# Patient Record
Sex: Female | Born: 1970 | Race: Black or African American | Hispanic: No | Marital: Single | Smoking: Never smoker
Health system: Southern US, Community
[De-identification: ages and names within clinical notes are randomized; demographics above are authoritative.]

## PROBLEM LIST (undated history)

## (undated) DIAGNOSIS — B009 Herpesviral infection, unspecified: Secondary | ICD-10-CM

## (undated) DIAGNOSIS — A749 Chlamydial infection, unspecified: Secondary | ICD-10-CM

## (undated) HISTORY — PX: WISDOM TOOTH EXTRACTION: SHX21

## (undated) HISTORY — PX: SALPINGECTOMY: SHX328

## (undated) HISTORY — DX: Herpesviral infection, unspecified: B00.9

## (undated) HISTORY — DX: Chlamydial infection, unspecified: A74.9

---

## 2002-06-19 ENCOUNTER — Other Ambulatory Visit: Admission: RE | Admit: 2002-06-19 | Discharge: 2002-06-19 | Payer: Self-pay | Admitting: Obstetrics & Gynecology

## 2004-07-27 ENCOUNTER — Other Ambulatory Visit: Admission: RE | Admit: 2004-07-27 | Discharge: 2004-07-27 | Payer: Self-pay | Admitting: Obstetrics and Gynecology

## 2007-07-20 ENCOUNTER — Inpatient Hospital Stay (HOSPITAL_COMMUNITY): Admission: AD | Admit: 2007-07-20 | Discharge: 2007-07-20 | Payer: Self-pay | Admitting: Obstetrics and Gynecology

## 2009-07-25 IMAGING — US US OB COMP LESS 14 WK
1 series · 14 of 28 positions shown · non-contrast
Comparison: none

DUPLICATE COPY for exam association in [facility] ? No change from original report.
CLINICAL DATA: 37-year-old female 7.5 weeks pregnant. Abdominal
pain and bleeding

OBSTETRIC <14 WK US AND TRANSVAGINAL OB US
TECHNIQUE: Both transabdominal and transvaginal ultrasound
examinations were performed for complete evaluation of the
gestation as well as the maternal uterus, adnexal regions, and
pelvic cul-de-sac.

[Series 1: us ob comp less 14 wks · 14 of 41 slices shown]
[im 2/41]
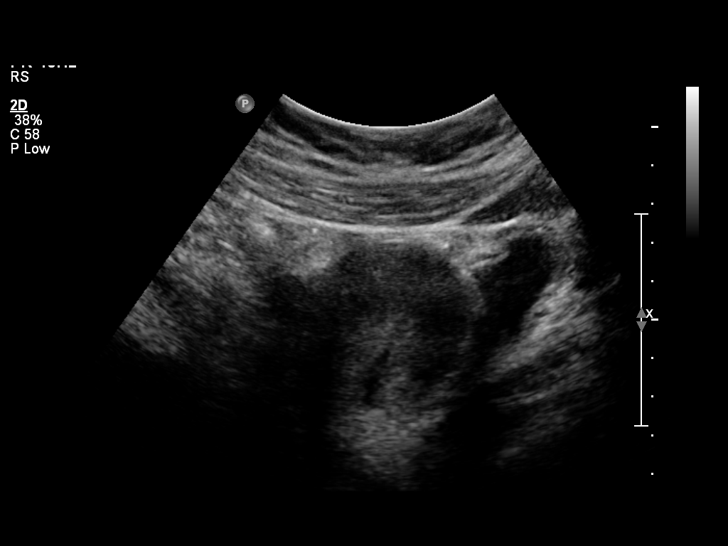
[im 5/41]
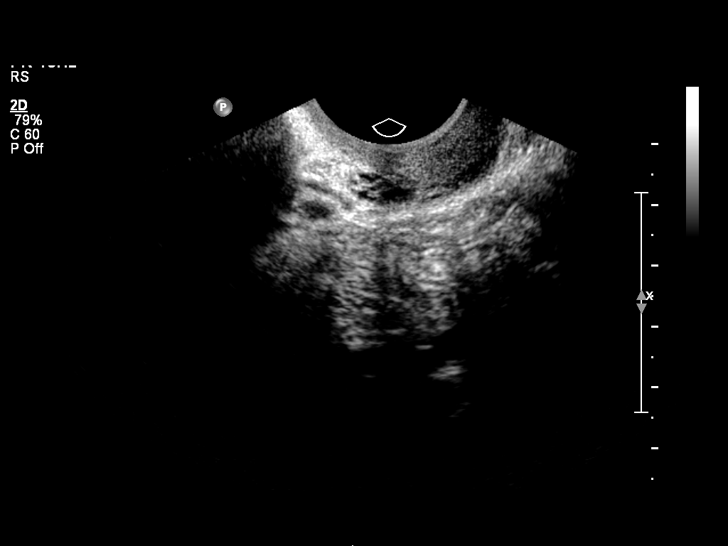
[im 8/41]
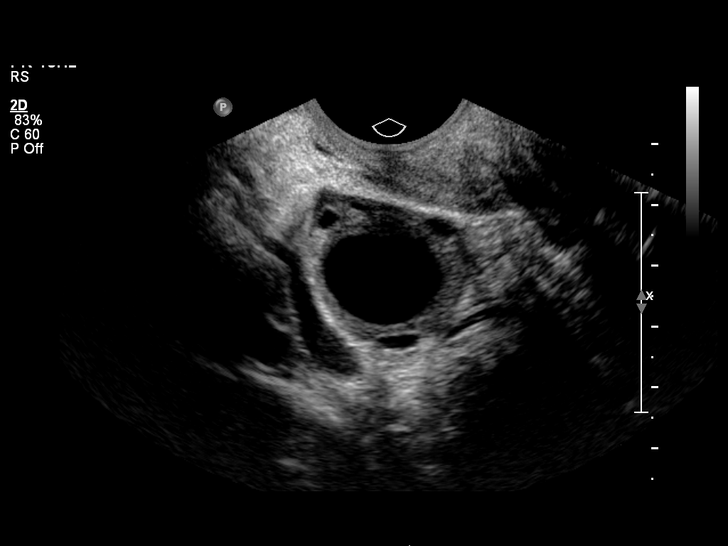
[im 11/41]
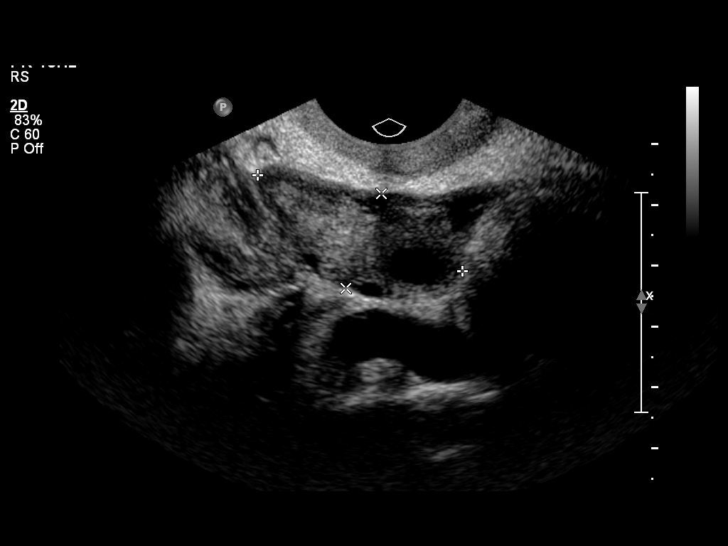
[im 14/41]
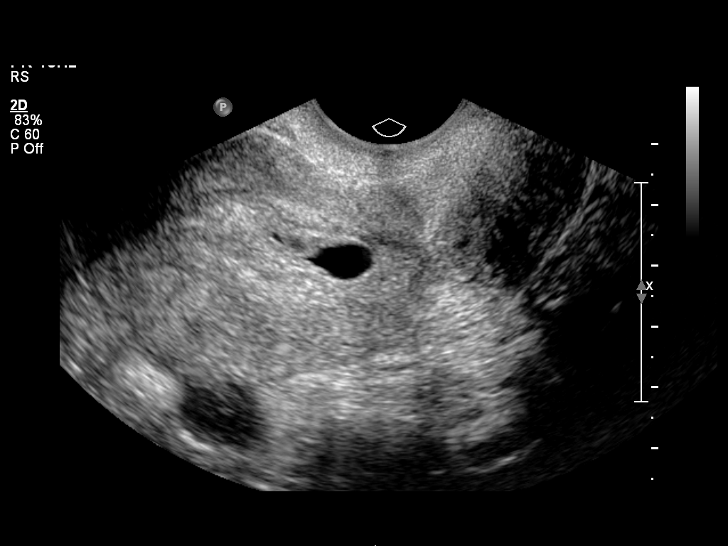
[im 17/41]
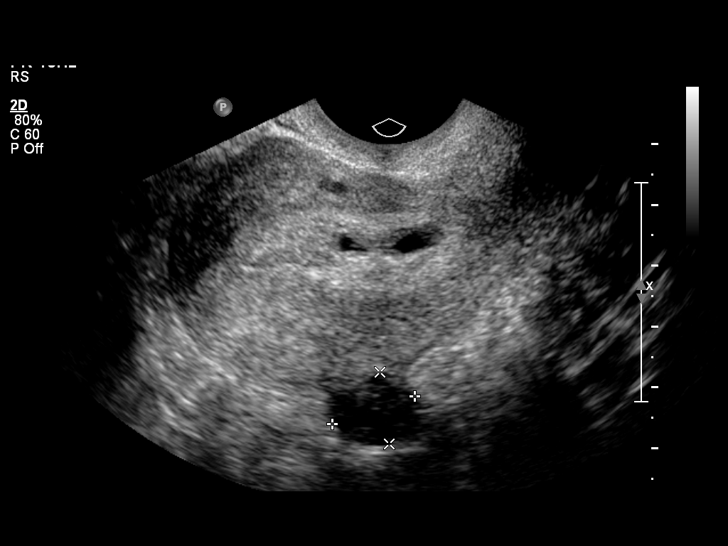
[im 20/41]
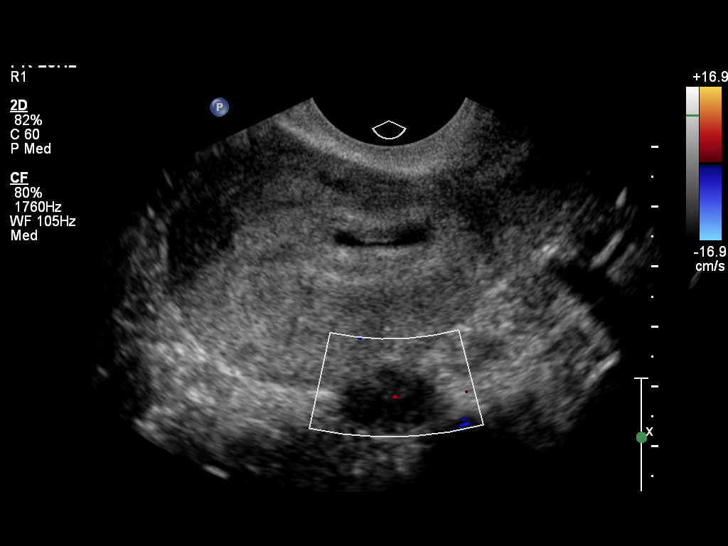
[im 23/41]
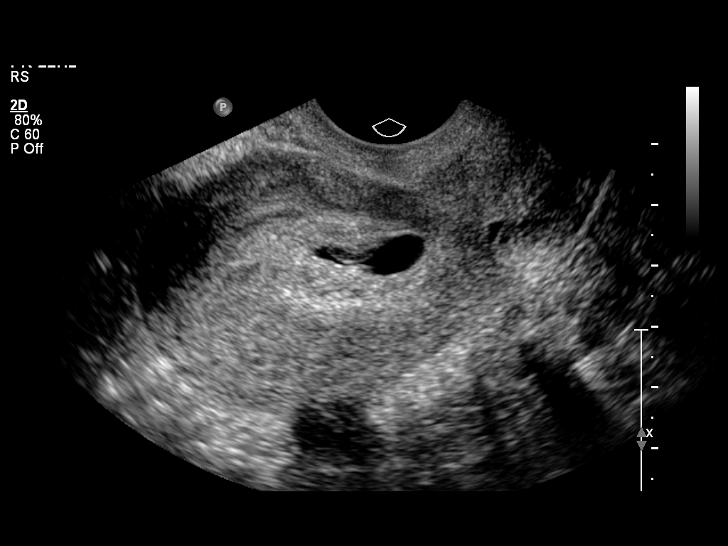
[im 26/41]
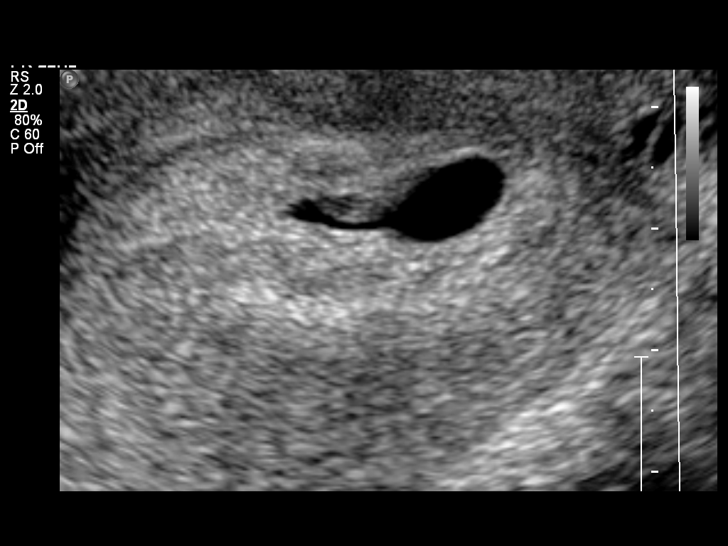
[im 29/41]
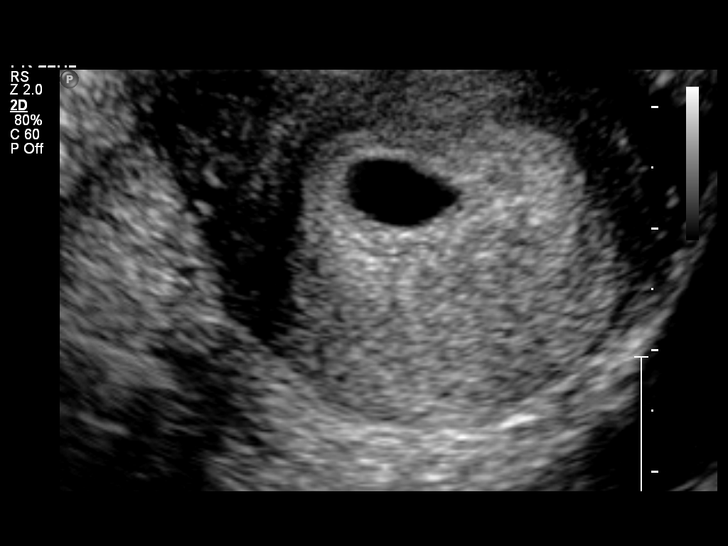
[im 32/41]
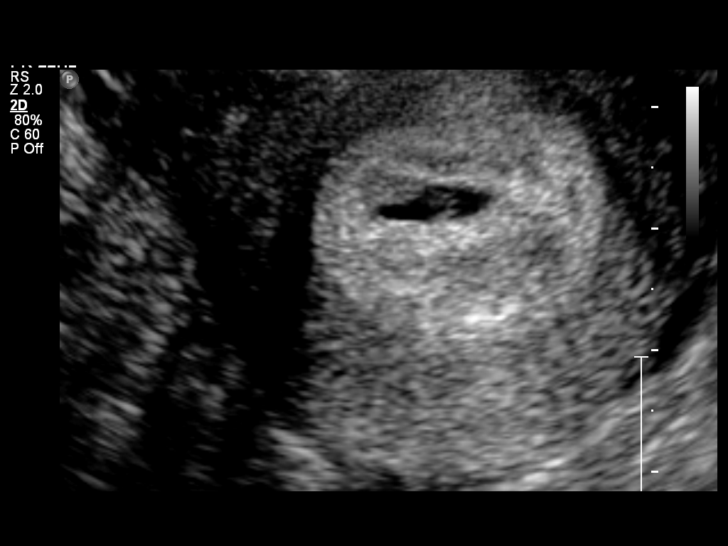
[im 35/41]
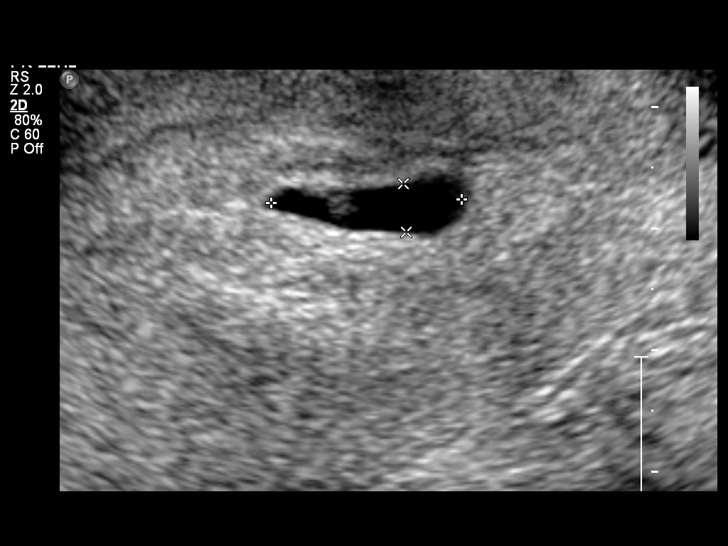
[im 38/41]
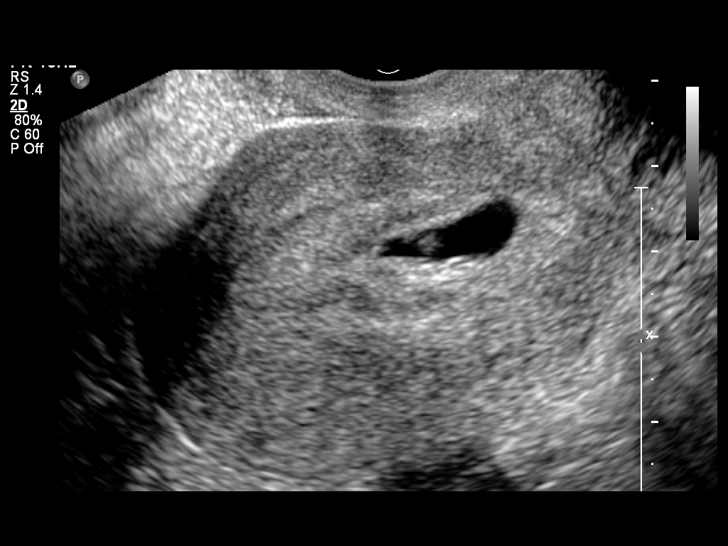
[im 41/41]
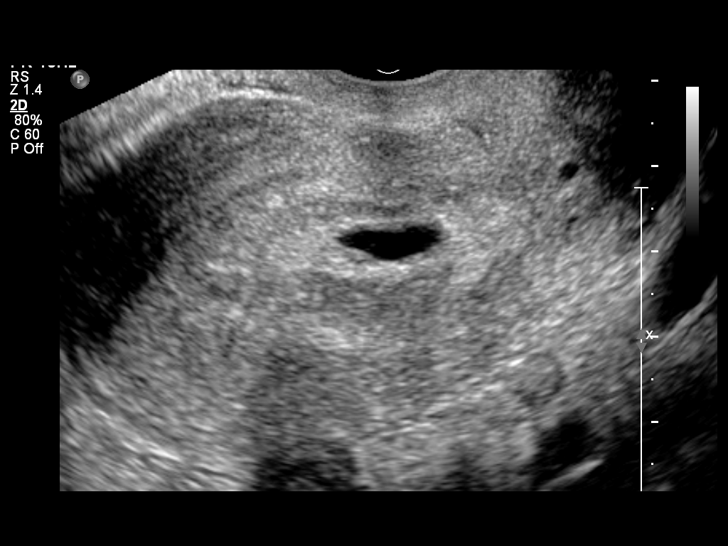

[14 of 28 positions shown; findings below may reference images not displayed]

There is a single intrauterine gestational sac which is slightly
asymmetric and positioned at the junction of the uterine fundus and
lower uterine segment. There is a yolk sac present. No fetal pole
identified.

The mean sac diameter equals 9.5 mm for a calculated gestational
age of 5 weeks and 4 days.

No evidence of subchorionic hemorrhage. The ovaries appear normal.
No intraperitoneal free fluid.
IMPRESSION: 1.. A single intrauterine gestational sac as described above.
Differential includes early pregnancy versus spontaneous abortion
in progress.

2. Estimated gestational age equals 5 weeks 4 days by mean sac
diameter.

## 2010-10-12 LAB — URINALYSIS, ROUTINE W REFLEX MICROSCOPIC
Ketones, ur: NEGATIVE
Leukocytes, UA: NEGATIVE
Protein, ur: NEGATIVE
Urobilinogen, UA: 0.2

## 2010-10-12 LAB — URINE MICROSCOPIC-ADD ON

## 2011-07-03 ENCOUNTER — Encounter: Payer: Self-pay | Admitting: Obstetrics and Gynecology

## 2011-07-03 ENCOUNTER — Ambulatory Visit (INDEPENDENT_AMBULATORY_CARE_PROVIDER_SITE_OTHER): Payer: Private Health Insurance - Indemnity | Admitting: Obstetrics and Gynecology

## 2011-07-03 VITALS — BP 120/78 | HR 80 | Ht 63.5 in | Wt 202.0 lb

## 2011-07-03 DIAGNOSIS — B009 Herpesviral infection, unspecified: Secondary | ICD-10-CM | POA: Insufficient documentation

## 2011-07-03 DIAGNOSIS — Z01419 Encounter for gynecological examination (general) (routine) without abnormal findings: Secondary | ICD-10-CM

## 2011-07-03 DIAGNOSIS — Z113 Encounter for screening for infections with a predominantly sexual mode of transmission: Secondary | ICD-10-CM

## 2011-07-03 DIAGNOSIS — N390 Urinary tract infection, site not specified: Secondary | ICD-10-CM

## 2011-07-03 DIAGNOSIS — Z124 Encounter for screening for malignant neoplasm of cervix: Secondary | ICD-10-CM

## 2011-07-03 DIAGNOSIS — A749 Chlamydial infection, unspecified: Secondary | ICD-10-CM | POA: Insufficient documentation

## 2011-07-03 NOTE — Progress Notes (Signed)
Regular Periods: yes Mammogram: no  Monthly Breast Ex.: yes Exercise: yes  Tetanus < 10 years: yes Seatbelts: yes  NI. Bladder Functn.: yes Abuse at home: no  Daily BM's: yes Stressful Work: yes  Healthy Diet: yes Sigmoid-Colonoscopy: NO  Calcium: yes Medical problems this year: NO PROBLEMS   LAST PAP:2012 NL  Contraception: WITHDRAWAL   Mammogram:  NO  PCP: NO  PMH: NO CHANGE  FMH: NO CHANGE  Last Bone Scan: NO

## 2011-07-03 NOTE — Progress Notes (Signed)
Subjective:    Carla Murphy is a 41 y.o. female, G1P0010, who presents for an annual exam. The patient requests STD testing.  Menstrual cycle:   LMP: Patient's last menstrual period was 06/20/2011.             Review of Systems Pertinent items are noted in HPI. Denies pelvic pain, urinary tract symptoms, vaginitis symptoms, irregular bleeding, menopausal symptoms, change in bowel habits or rectal bleeding   Objective:    BP 120/78  Pulse 80  Ht 5' 3.5" (1.613 m)  Wt 202 lb (91.627 kg)  BMI 35.22 kg/m2  LMP 06/20/2011   Wt Readings from Last 1 Encounters:  07/03/11 202 lb (91.627 kg)   Body mass index is 35.22 kg/(m^2). General Appearance: Alert, no acute distress HEENT: Grossly normal Neck / Thyroid: Supple, no thyromegaly or cervical adenopathy Lungs: Clear to auscultation bilaterally Back: No CVA tenderness Breast Exam: No masses or nodes.No dimpling, nipple retraction or discharge. Cardiovascular: Regular rate and rhythm.  Gastrointestinal: Soft, non-tender, no masses or organomegaly Pelvic Exam: EGBUS-wnl, vagina-normal rugae, cervix- without lesions or tenderness, uterus upper limits of normal size, without tenderness; adnexae-no masses or tenderness Lymphatic Exam: Non-palpable nodes in neck, clavicular,  axillary, or inguinal regions  Skin: no rashes or abnormalities Extremities: no clubbing cyanosis or edema  Neurologic: grossly normal Psychiatric: Alert and oriented   Assessment:   Routine GYN Exam   Plan:  Recommended folic acid or folate 400 mcg  or more daily since patient is not using a reliable method of contraception.  PAP sent  STD testing  RTO 1 year or prn  Rahma Meller,[email or web address]

## 2011-07-04 LAB — HIV ANTIBODY (ROUTINE TESTING W REFLEX): HIV: NONREACTIVE

## 2011-07-05 LAB — PAP IG, CT-NG, RFX HPV ASCU: Chlamydia Probe Amp: NEGATIVE

## 2013-08-11 ENCOUNTER — Other Ambulatory Visit: Payer: Self-pay | Admitting: Obstetrics and Gynecology

## 2013-08-11 DIAGNOSIS — Z1231 Encounter for screening mammogram for malignant neoplasm of breast: Secondary | ICD-10-CM

## 2013-08-20 ENCOUNTER — Ambulatory Visit
Admission: RE | Admit: 2013-08-20 | Discharge: 2013-08-20 | Disposition: A | Discharge status: Home / Self Care | Payer: BC Managed Care – PPO | Source: Ambulatory Visit | Attending: Obstetrics and Gynecology | Admitting: Obstetrics and Gynecology

## 2013-08-20 ENCOUNTER — Encounter (INDEPENDENT_AMBULATORY_CARE_PROVIDER_SITE_OTHER): Payer: Self-pay

## 2013-08-20 DIAGNOSIS — Z1231 Encounter for screening mammogram for malignant neoplasm of breast: Secondary | ICD-10-CM

## 2013-08-21 ENCOUNTER — Other Ambulatory Visit: Payer: Self-pay | Admitting: Obstetrics and Gynecology

## 2013-08-21 DIAGNOSIS — R928 Other abnormal and inconclusive findings on diagnostic imaging of breast: Secondary | ICD-10-CM

## 2013-08-27 ENCOUNTER — Ambulatory Visit
Admission: RE | Admit: 2013-08-27 | Discharge: 2013-08-27 | Disposition: A | Discharge status: Home / Self Care | Payer: BC Managed Care – PPO | Source: Ambulatory Visit | Attending: Obstetrics and Gynecology | Admitting: Obstetrics and Gynecology

## 2013-08-27 DIAGNOSIS — R928 Other abnormal and inconclusive findings on diagnostic imaging of breast: Secondary | ICD-10-CM

## 2013-08-28 ENCOUNTER — Other Ambulatory Visit: Payer: BC Managed Care – PPO

## 2013-10-30 ENCOUNTER — Other Ambulatory Visit: Payer: Self-pay

## 2013-11-16 ENCOUNTER — Encounter: Payer: Self-pay | Admitting: Obstetrics and Gynecology
